# Patient Record
Sex: Male | Born: 1995 | Race: Black or African American | Hispanic: No | Marital: Single | Smoking: Never smoker
Health system: Southern US, Community
[De-identification: ages and names within clinical notes are randomized; demographics above are authoritative.]

---
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
## 2019-08-10 DIAGNOSIS — B356 Tinea cruris: Secondary | ICD-10-CM | POA: Diagnosis not present

## 2019-11-28 DIAGNOSIS — J209 Acute bronchitis, unspecified: Secondary | ICD-10-CM | POA: Diagnosis not present

## 2019-11-28 DIAGNOSIS — Z20828 Contact with and (suspected) exposure to other viral communicable diseases: Secondary | ICD-10-CM | POA: Diagnosis not present

## 2019-11-28 DIAGNOSIS — R05 Cough: Secondary | ICD-10-CM | POA: Diagnosis not present

## 2019-11-28 DIAGNOSIS — R0981 Nasal congestion: Secondary | ICD-10-CM | POA: Diagnosis not present

## 2020-08-26 DIAGNOSIS — M79652 Pain in left thigh: Secondary | ICD-10-CM | POA: Diagnosis not present

## 2021-09-11 ENCOUNTER — Emergency Department (HOSPITAL_COMMUNITY): Payer: BC Managed Care – PPO

## 2021-09-11 ENCOUNTER — Emergency Department (HOSPITAL_COMMUNITY)
Admission: EM | Admit: 2021-09-11 | Discharge: 2021-09-12 | Disposition: A | Discharge status: Home / Self Care | Payer: BC Managed Care – PPO | Attending: Emergency Medicine | Admitting: Emergency Medicine

## 2021-09-11 ENCOUNTER — Other Ambulatory Visit: Payer: Self-pay

## 2021-09-11 ENCOUNTER — Encounter (HOSPITAL_COMMUNITY): Payer: Self-pay

## 2021-09-11 DIAGNOSIS — H509 Unspecified strabismus: Secondary | ICD-10-CM | POA: Diagnosis not present

## 2021-09-11 DIAGNOSIS — H532 Diplopia: Secondary | ICD-10-CM | POA: Diagnosis not present

## 2021-09-11 DIAGNOSIS — H5712 Ocular pain, left eye: Secondary | ICD-10-CM | POA: Diagnosis not present

## 2021-09-11 DIAGNOSIS — R519 Headache, unspecified: Secondary | ICD-10-CM | POA: Diagnosis not present

## 2021-09-11 LAB — CBC
HCT: 48.4 % (ref 39.0–52.0)
Hemoglobin: 16.3 g/dL (ref 13.0–17.0)
MCH: 29.6 pg (ref 26.0–34.0)
MCHC: 33.7 g/dL (ref 30.0–36.0)
MCV: 87.8 fL (ref 80.0–100.0)
Platelets: 171 10*3/uL (ref 150–400)
RBC: 5.51 MIL/uL (ref 4.22–5.81)
RDW: 11.4 % — ABNORMAL LOW (ref 11.5–15.5)
WBC: 7.9 10*3/uL (ref 4.0–10.5)
nRBC: 0 % (ref 0.0–0.2)

## 2021-09-11 LAB — BASIC METABOLIC PANEL
Anion gap: 5 (ref 5–15)
BUN: 20 mg/dL (ref 6–20)
CO2: 28 mmol/L (ref 22–32)
Calcium: 9.6 mg/dL (ref 8.9–10.3)
Chloride: 107 mmol/L (ref 98–111)
Creatinine, Ser: 1.1 mg/dL (ref 0.61–1.24)
GFR, Estimated: >60 mL/min (ref >60)
Glucose, Bld: 95 mg/dL (ref 70–99)
Potassium: 4 mmol/L (ref 3.5–5.1)
Sodium: 140 mmol/L (ref 135–145)

## 2021-09-11 NOTE — ED Provider Triage Note (Signed)
Emergency Medicine Provider Triage Evaluation Note  Lee Delgado , a 26 y.o. male  was evaluated in triage.  Pt was seen earlier today in urgent care and advised to come to ED for head CT.  Patient was playing basketball last Wednesday when he ran into another player causing him to fall to the ground.  The patient denies hitting his head or losing consciousness.  Patient states that beginning on Monday he developed left-sided headache along with double vision and left eye.  Patient denies any numbness, weakness, nausea, vomiting, chest pain, headache, neck pain.  Review of Systems  Positive:  Negative:   Physical Exam  BP 137/81 (BP Location: Right Arm)   Pulse 70   Temp 98.9 F (37.2 C) (Oral)   Resp 18   SpO2 100%  Gen:   Awake, no distress   Resp:  Normal effort  MSK:   Moves extremities without difficulty  Other:  No focal neurodeficits on examination.  Patient has no pain with EOM.  Medical Decision Making  Medically screening exam initiated at 8:56 PM.  Appropriate orders placed.  Lee Delgado was informed that the remainder of the evaluation will be completed by another provider, this initial triage assessment does not replace that evaluation, and the importance of remaining in the ED until their evaluation is complete.     Al Decant, PA-C 09/11/21 2057

## 2021-09-11 NOTE — ED Provider Notes (Signed)
[facility] EMERGENCY DEPARTMENT Provider Note   CSN: [id number] Arrival date & time: 09/11/21  2007     History  Chief Complaint  Patient presents with   Eye Pain    Lee Delgado is a 26 y.o. male.   Eye Pain   Patient presents to the ED for evaluation of diplopia.  Patient states he was playing basketball on Wednesday.  He ran into another player and ended up falling to the ground.  He did not hit his head or lose consciousness.  Patient then started developing issues with headache and double vision.  Patient states he is not having trouble with his speech.  No vomiting or diarrhea.  No focal numbness or weakness.  Diplopia resolves when he closes 1 eye.  Patient states he went to an urgent care and he was sent to the ED for further evaluation.  Patient's girlfriend states earlier it looked like his eyes were almost cross side  Home Medications Prior to Admission medications   Not on File      Allergies    Patient has no allergy information on record.    Review of Systems   Review of Systems  Constitutional:  Negative for fever.  Eyes:  Positive for pain.   Physical Exam Updated Vital Signs BP (!) 141/84   Pulse 60   Temp 98.9 F (37.2 C) (Oral)   Resp 16   SpO2 98%  Physical Exam Vitals and nursing note reviewed.  Constitutional:      General: He is not in acute distress.    Appearance: He is well-developed.  HENT:     Head: Normocephalic and atraumatic.     Right Ear: External ear normal.     Left Ear: External ear normal.  Eyes:     General: No scleral icterus.       Right eye: No discharge.        Left eye: No discharge.     Extraocular Movements: Extraocular movements intact.     Conjunctiva/sclera: Conjunctivae normal.     Pupils: Pupils are equal, round, and reactive to light.  Neck:     Trachea: No tracheal deviation.  Cardiovascular:     Rate and Rhythm: Normal rate and regular rhythm.  Pulmonary:     Effort: Pulmonary  effort is normal. No respiratory distress.     Breath sounds: Normal breath sounds. No stridor. No wheezing or rales.  Abdominal:     General: Bowel sounds are normal. There is no distension.     Palpations: Abdomen is soft.     Tenderness: There is no abdominal tenderness. There is no guarding or rebound.  Musculoskeletal:        General: No tenderness or deformity.     Cervical back: Neck supple.  Skin:    General: Skin is warm and dry.     Findings: No rash.  Neurological:     General: No focal deficit present.     Mental Status: He is alert.     Cranial Nerves: No cranial nerve deficit (no facial droop, extraocular movements intact, no slurred speech).     Sensory: No sensory deficit.     Motor: No abnormal muscle tone or seizure activity.     Coordination: Coordination normal.  Psychiatric:        Mood and Affect: Mood normal.    ED Results / Procedures / Treatments   Labs (all labs ordered are listed, but only abnormal results are displayed)  Labs Reviewed  CBC - Abnormal; Notable for the following components:      Result Value   RDW 11.4 (*)    All other components within normal limits  BASIC METABOLIC PANEL    EKG None  Radiology CT Head Wo Contrast  Result Date: 09/11/2021 CLINICAL DATA:  Head trauma, moderate-severe Patient reports eye pain and headache. EXAM: CT HEAD WITHOUT CONTRAST TECHNIQUE: Contiguous axial images were obtained from the base of the skull through the vertex without intravenous contrast. RADIATION DOSE REDUCTION: This exam was performed according to the departmental dose-optimization program which includes automated exposure control, adjustment of the mA and/or kV according to patient size and/or use of iterative reconstruction technique. COMPARISON:  None Available. FINDINGS: Brain: No intracranial hemorrhage, mass effect, or midline shift. No hydrocephalus. The basilar cisterns are patent. No evidence of territorial infarct or acute ischemia. No  extra-axial or intracranial fluid collection. Vascular: No hyperdense vessel or unexpected calcification. Skull: No fracture or focal lesion. Sinuses/Orbits: Small mucous retention cyst left maxillary sinus. Paranasal sinuses are otherwise clear. Mastoid air cells are well aerated. The visualized orbits are unremarkable. Other: None. IMPRESSION: Negative noncontrast head CT. Electronically Signed   By: Narda Rutherford M.D.   On: 09/11/2021 22:12    Procedures Procedures    Medications Ordered in ED Medications - No data to display  ED Course/ Medical Decision Making/ A&P Clinical Course as of 09/11/21 2342  Thu Sep 11, 2021  2342 Basic metabolic panel Normal [JK]  2342 CBC(!) White blood cell count pending but otherwise normal [JK]  2342 Head CT without acute findings [JK]    Clinical Course User Index [JK] Linwood Dibbles, MD                           Medical Decision Making Patient presents with diplopia differential concerning for the possibility stroke, vascular abnormality, cranial nerve palsy, MS, doubt related to concussion  Amount and/or Complexity of Data Reviewed Labs: ordered. Radiology: ordered and independent interpretation performed.    Details: Head CT without acute findings   Patient presents to the ED with complaints of diplopia.  Presentation concerning for the possibility of stroke, MS, cranial nerve palsy.  We will proceed with MRI of the brain and orbits.  Results are pending at the time of shift change.  Care turned over to oncoming MD        Final Clinical Impression(s) / ED Diagnoses Final diagnoses:  Diplopia    Rx / DC Orders ED Discharge Orders     None         Linwood Dibbles, MD 09/11/21 2348

## 2021-09-11 NOTE — ED Triage Notes (Signed)
Pt reports he was seen at [facility] today due to eye pain / headache and was sent to ED for further eval.

## 2021-09-12 DIAGNOSIS — H532 Diplopia: Secondary | ICD-10-CM | POA: Diagnosis not present

## 2021-09-12 DIAGNOSIS — H4912 Fourth [trochlear] nerve palsy, left eye: Secondary | ICD-10-CM | POA: Diagnosis not present

## 2021-09-12 MED ORDER — GADOBUTROL 1 MMOL/ML IV SOLN
7.0000 mL | Freq: Once | INTRAVENOUS | Status: AC | PRN
  Administered 2021-09-12: 7 mL via INTRAVENOUS

## 2021-09-12 NOTE — ED Provider Notes (Signed)
Patient Physical Exam  BP 124/80   Pulse 60   Temp 98.9 F (37.2 C) (Oral)   Resp 16   SpO2 98%   Physical Exam Vitals and nursing note reviewed.  Constitutional:      General: He is not in acute distress.    Appearance: Normal appearance. He is not ill-appearing.  HENT:     Head: Normocephalic and atraumatic.  Pulmonary:     Effort: Pulmonary effort is normal.  Skin:    General: Skin is warm and dry.  Neurological:     Mental Status: He is alert and oriented to person, place, and time.    Procedures  Procedures  ED Course / MDM   Clinical Course as of 09/12/21 0220  Thu Sep 11, 2021  2342 Basic metabolic panel Normal [JK]  2342 CBC(!) White blood cell count pending but otherwise normal [JK]  2342 Head CT without acute findings [JK]    Clinical Course User Index [JK] Linwood Dibbles, MD   Care assumed from Dr. Lynelle Doctor at shift change.  Awaiting results of MRI of the brain and orbits to evaluate for the cause of his double vision.  The studies have returned and are unremarkable.  He is well-appearing and in no distress.  He does describe some binocular double vision that corrects with covering either eye.  Nothing tonight appears emergent.  Patient will be referred to ophthalmology for outpatient eye exam.       Geoffery Lyons, MD 09/12/21 0221

## 2021-09-12 NOTE — ED Notes (Signed)
Pt and visitor express frustration about wait time associated with MRI results. Explained the process of receiving results in ER and encouraged pt to stay for results.

## 2021-09-12 NOTE — ED Notes (Signed)
Pt verbalized understanding of d/c instructions, meds, and followup care. Denies questions. VSS, no distress noted. Steady gait to exit with all belongings.  ?

## 2021-09-12 NOTE — Discharge Instructions (Signed)
Follow-up with ophthalmology in the next 2 to 3 days.  The contact information for Dr. Tobe Sos has been provided in this discharge summary for you to call and make these arrangements.

## 2021-09-15 DIAGNOSIS — Z6822 Body mass index (BMI) 22.0-22.9, adult: Secondary | ICD-10-CM | POA: Diagnosis not present

## 2021-09-15 DIAGNOSIS — H532 Diplopia: Secondary | ICD-10-CM | POA: Diagnosis not present

## 2021-10-09 DIAGNOSIS — H538 Other visual disturbances: Secondary | ICD-10-CM | POA: Diagnosis not present

## 2021-10-09 DIAGNOSIS — H5010 Unspecified exotropia: Secondary | ICD-10-CM | POA: Diagnosis not present

## 2021-10-09 DIAGNOSIS — H532 Diplopia: Secondary | ICD-10-CM | POA: Diagnosis not present

## 2022-11-18 ENCOUNTER — Ambulatory Visit
Admission: EM | Admit: 2022-11-18 | Discharge: 2022-11-18 | Disposition: A | Discharge status: Home / Self Care | Payer: BC Managed Care – PPO

## 2022-11-18 DIAGNOSIS — J029 Acute pharyngitis, unspecified: Secondary | ICD-10-CM | POA: Diagnosis not present

## 2022-11-18 NOTE — ED Provider Notes (Signed)
[facility]    CSN: [id number] Arrival date & time: 11/18/22  1308      History   Chief Complaint Chief Complaint  Patient presents with   Sore Throat    HPI Lee Delgado is a 27 y.o. male.   Patient presents to urgent care for evaluation of sore throat that started yesterday. Reports intermittent sneezing and post nasal drip as well. Sore throat is worsened with swallowing and is currently 6/10, improves with use of halls cough drops. Denies cough, headache, fevers, chills, N/V/D, abdominal pain, or rash. No recent sick contacts. Never smoker, denies drug use. No trismus or difficulty swallowing. No ear pain, eye redness/itching, etc. Has been using cough drops for symptoms with some relief.  Took alkaseltzer tablet and allergy pill and this didn't really help.    Sore Throat    History reviewed. No pertinent past medical history.  There are no problems to display for this patient.   History reviewed. No pertinent surgical history.     Home Medications    Prior to Admission medications   Not on File    Family History History reviewed. No pertinent family history.  Social History Social History   Tobacco Use   Smoking status: Never   Smokeless tobacco: Never     Allergies   Patient has no known allergies.   Review of Systems Review of Systems Per HPI  Physical Exam Triage Vital Signs ED Triage Vitals  Encounter Vitals Group     BP 11/18/22 0935 136/84     Systolic BP Percentile --      Diastolic BP Percentile --      Pulse Rate 11/18/22 0935 82     Resp 11/18/22 0935 18     Temp 11/18/22 0935 98 F (36.7 C)     Temp Source 11/18/22 0935 Oral     SpO2 11/18/22 0935 97 %     Weight --      Height --      Head Circumference --      Peak Flow --      Pain Score 11/18/22 0934 7     Pain Loc --      Pain Education --      Exclude from Growth Chart --    No data found.  Updated Vital Signs BP 136/84 (BP Location: Right Arm)    Pulse 82   Temp 98 F (36.7 C) (Oral)   Resp 18   SpO2 97%   Visual Acuity Right Eye Distance:   Left Eye Distance:   Bilateral Distance:    Right Eye Near:   Left Eye Near:    Bilateral Near:     Physical Exam Vitals and nursing note reviewed.  Constitutional:      Appearance: He is not ill-appearing or toxic-appearing.  HENT:     Head: Normocephalic and atraumatic.     Right Ear: Hearing, tympanic membrane, ear canal and external ear normal.     Left Ear: Hearing, tympanic membrane, ear canal and external ear normal.     Nose: Nose normal.     Mouth/Throat:     Lips: Pink.     Mouth: Mucous membranes are moist. No injury.     Tongue: No lesions. Tongue does not deviate from midline.     Palate: No mass and lesions.     Pharynx: Oropharynx is clear. Uvula midline. No pharyngeal swelling, oropharyngeal exudate, posterior oropharyngeal erythema or uvula swelling.  Tonsils: No tonsillar exudate or tonsillar abscesses.  Eyes:     General: Lids are normal. Vision grossly intact. Gaze aligned appropriately.     Extraocular Movements: Extraocular movements intact.     Conjunctiva/sclera: Conjunctivae normal.  Cardiovascular:     Rate and Rhythm: Normal rate and regular rhythm.     Heart sounds: Normal heart sounds, S1 normal and S2 normal.  Pulmonary:     Effort: Pulmonary effort is normal. No respiratory distress.     Breath sounds: Normal breath sounds and air entry.  Musculoskeletal:     Cervical back: Neck supple.  Skin:    General: Skin is warm and dry.     Capillary Refill: Capillary refill takes less than 2 seconds.     Findings: No rash.  Neurological:     General: No focal deficit present.     Mental Status: He is alert and oriented to person, place, and time. Mental status is at baseline.     Cranial Nerves: No dysarthria or facial asymmetry.  Psychiatric:        Mood and Affect: Mood normal.        Speech: Speech normal.        Behavior: Behavior normal.         Thought Content: Thought content normal.        Judgment: Judgment normal.      UC Treatments / Results  Labs (all labs ordered are listed, but only abnormal results are displayed) Labs Reviewed - No data to display  EKG   Radiology No results found.  Procedures Procedures (including critical care time)  Medications Ordered in UC Medications - No data to display  Initial Impression / Assessment and Plan / UC Course  I have reviewed the triage vital signs and the nursing notes.  Pertinent labs & imaging results that were available during my care of the patient were reviewed by me and considered in my medical decision making (see chart for details).   1. Viral pharyngitis Evaluation suggests viral pharyngitis etiology.  Low suspicion for bacterial pharyngitis, patient does not meet [facility] criteria for group A strep testing, therefore deferred this with patient agreement.  Low suspicion for mononucleosis, epiglottitis, peritonsillar abscess, etc.  HEENT exam stable without red flag signs. Will manage this conservatively with supportive care. Tylenol/ibuprofen as needed for pain and inflammation. Salt water gargles as needed, warm water with honey.   Counseled patient on potential for adverse effects with medications prescribed/recommended today, strict ER and return-to-clinic precautions discussed, patient verbalized understanding.    Final Clinical Impressions(s) / UC Diagnoses   Final diagnoses:  Viral pharyngitis     Discharge Instructions      For now, we will treat this as a viral infection with the following: - Over the counter medicines as needed for pain and swelling of the throat (Ibuprofen 600mg  and/or Tylenol 1,000mg  every 6 hours as needed) - 1 tablespoon of honey in warm water and/or salt water gargles every 3-4 hours  Seek medical care if you develop changes to your voice, inability to swallow, drooling, or any new symptoms. If symptoms are  severe, please go to the ER. I hope you feel better!      ED Prescriptions   None    PDMP not reviewed this encounter.   Carlisle Beers, [place] 11/18/22 1032

## 2022-11-18 NOTE — Discharge Instructions (Signed)
For now, we will treat this as a viral infection with the following: - Over the counter medicines as needed for pain and swelling of the throat (Ibuprofen 600mg  and/or Tylenol 1,000mg  every 6 hours as needed) - 1 tablespoon of honey in warm water and/or salt water gargles every 3-4 hours  Seek medical care if you develop changes to your voice, inability to swallow, drooling, or any new symptoms. If symptoms are severe, please go to the ER. I hope you feel better!

## 2022-11-18 NOTE — ED Triage Notes (Signed)
Pt presents with a sore throat since yesterday morning. Pt has taken an allergy pill and alka seltzer yesterday before going to sleep. Pt denies fever.

## 2023-07-17 IMAGING — MR MR HEAD WO/W CM
14 of 16 series · 42 of 48 positions shown · IV contrast (gadavist)
Comparison: Prior CT from 09/11/2021.

CLINICAL DATA: Initial evaluation for diplopia.

EXAM:
MRI HEAD AND ORBITS WITHOUT AND WITH CONTRAST
TECHNIQUE: Multiplanar, multiecho pulse sequences of the brain and surrounding
structures were obtained without and with intravenous contrast.
Multiplanar, multiecho pulse sequences of the orbits and surrounding
structures were obtained including fat saturation techniques, before
and after intravenous contrast administration.
CONTRAST:  7mL GADAVIST GADOBUTROL 1 MMOL/ML IV SOLN

[Series 5: T1 · sagittal · 5.0mm · 0.75mm/px · 3 of 24 slices shown]
[im 1/24]
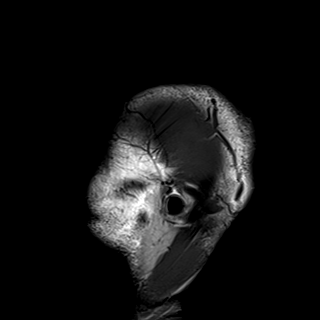
[im 12/24]
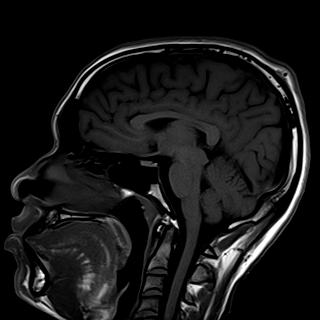
[im 24/24]
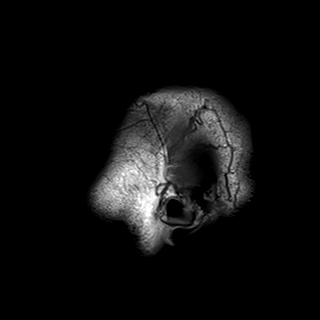

[Series 6: mag_images · axial · 3.0mm · 0.90mm/px · z∈[-7,+145]mm · 3 of 52 slices shown]
[im 1/52]
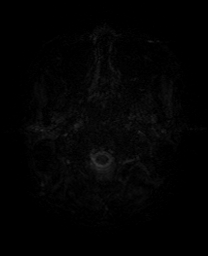
[im 26/52]
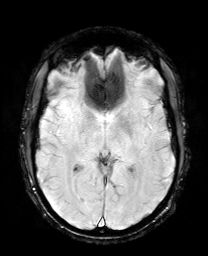
[im 52/52]
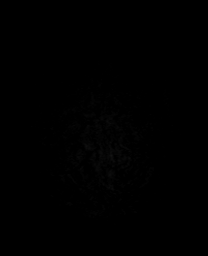

[Series 7: pha_images · axial · 3.0mm · 0.90mm/px · z∈[-7,+142]mm · 3 of 51 slices shown]
[im 1/51]
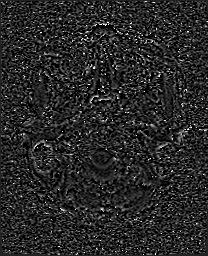
[im 26/51]
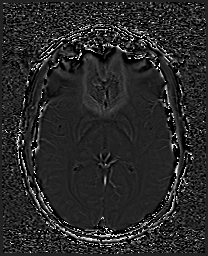
[im 51/51]
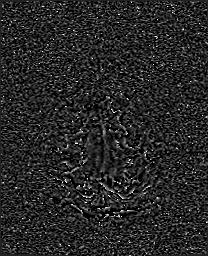

[Series 8: swi_images · axial · 3.0mm · 0.90mm/px · z∈[-7,+145]mm · 3 of 52 slices shown]
[im 1/52]
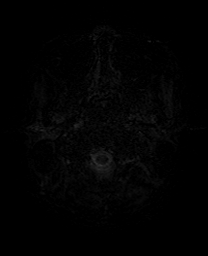
[im 26/52]
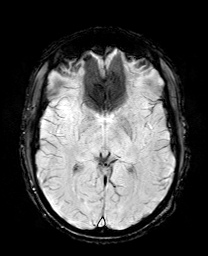
[im 52/52]
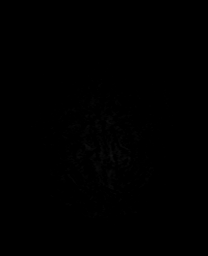

[Series 9: mip_images(sw) · axial · 24.0mm · 0.90mm/px · z∈[+3,+135]mm · 3 of 45 slices shown]
[im 1/45]
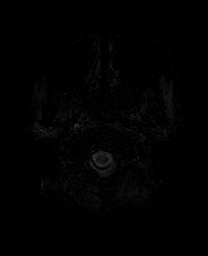
[im 23/45]
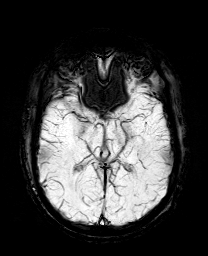
[im 45/45]
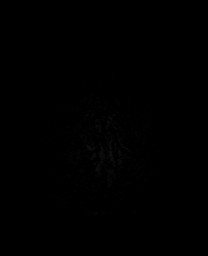

[Series 10: DWI · axial · 3.0mm · 0.88mm/px · z∈[-7,+139]mm · 7 of 100 slices shown (1 of 4)]
[im 1/100]
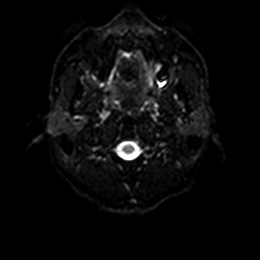
[im 17/100]
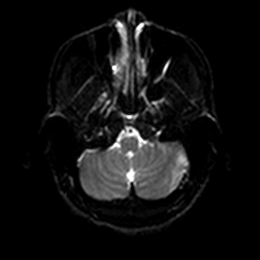
[im 34/100]
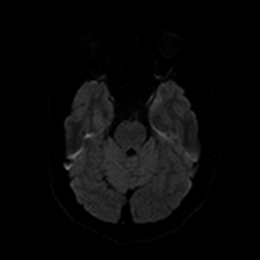
[im 50/100]
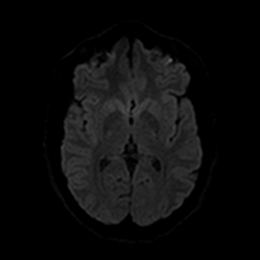
[im 67/100]
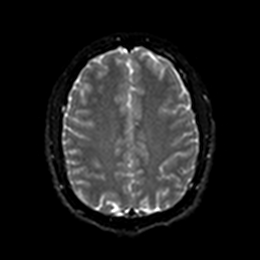
[im 83/100]
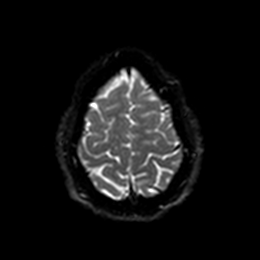
[im 100/100]
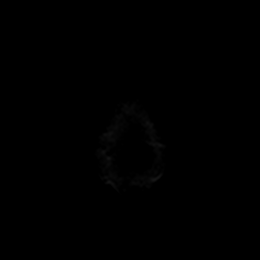

[Series 11: DWI · axial · 3.0mm · 0.88mm/px · z∈[-7,+139]mm · 3 of 49 slices shown (2 of 4)]
[im 1/49]
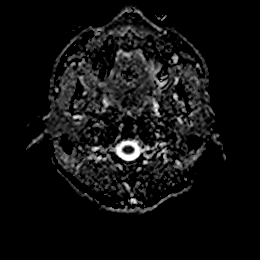
[im 25/49]
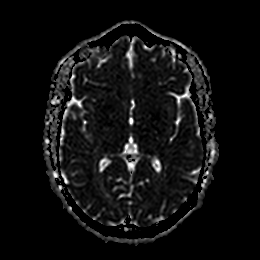
[im 49/49]
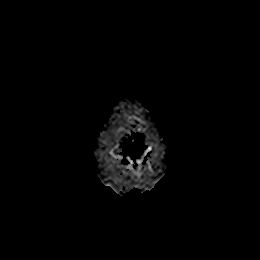

[Series 12: DWI · coronal · 4.0mm · 0.88mm/px · 5 of 72 slices shown (3 of 4)]
[im 1/72]
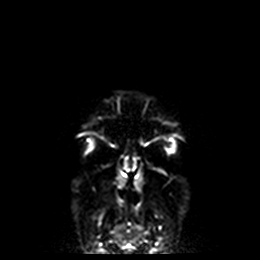
[im 18/72]
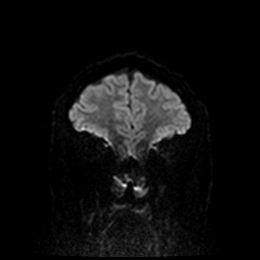
[im 36/72]
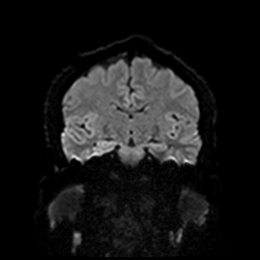
[im 54/72]
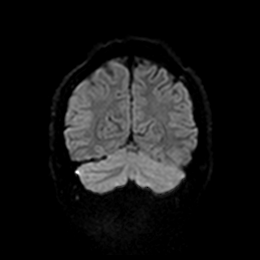
[im 72/72]
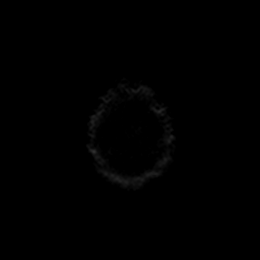

[Series 13: DWI · coronal · 4.0mm · 0.88mm/px · 2 of 36 slices shown (4 of 4)]
[im 1/36]
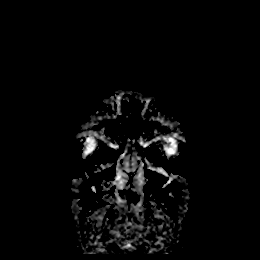
[im 36/36]
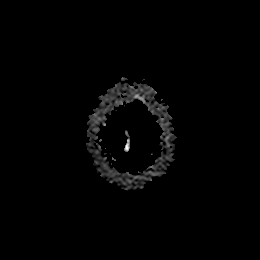

[Series 14: T2 · axial · 5.0mm · 0.72mm/px · z∈[-10,+140]mm · 2 of 26 slices shown]
[im 1/26]
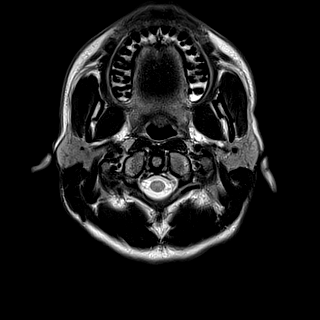
[im 26/26]
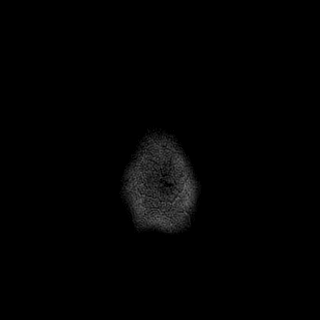

[Series 15: FLAIR · axial · 5.0mm · 0.45mm/px · z∈[-10,+140]mm · 2 of 26 slices shown]
[im 1/26]
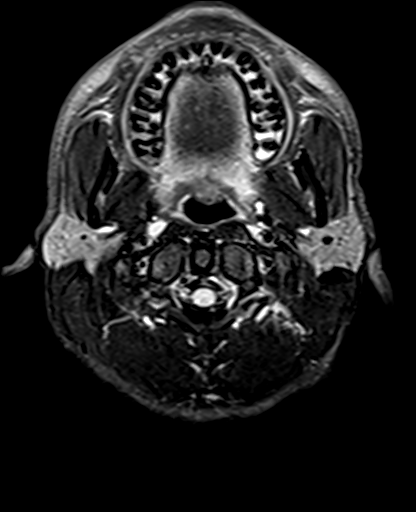
[im 26/26]
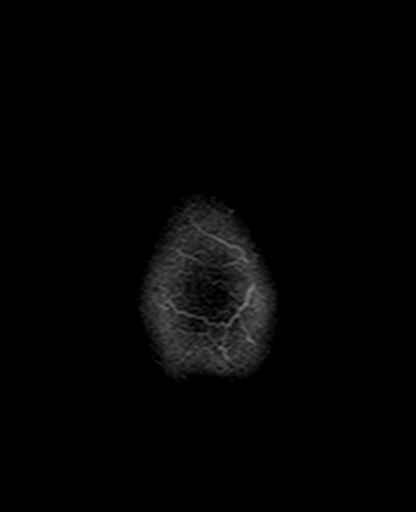

[Series 17: T2 post-contrast · coronal · 5.0mm · 0.72mm/px · 2 of 31 slices shown]
[im 1/31]
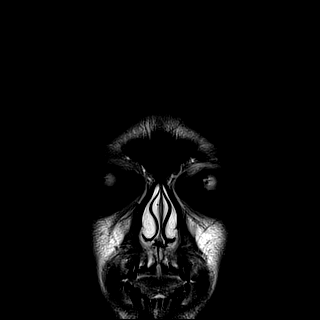
[im 31/31]
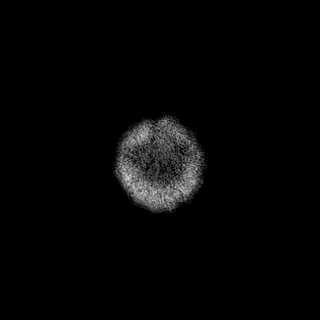

[Series 19: T1 post-contrast · coronal · 5.0mm · 0.34mm/px · 2 of 31 slices shown (1 of 2)]
[im 1/31]
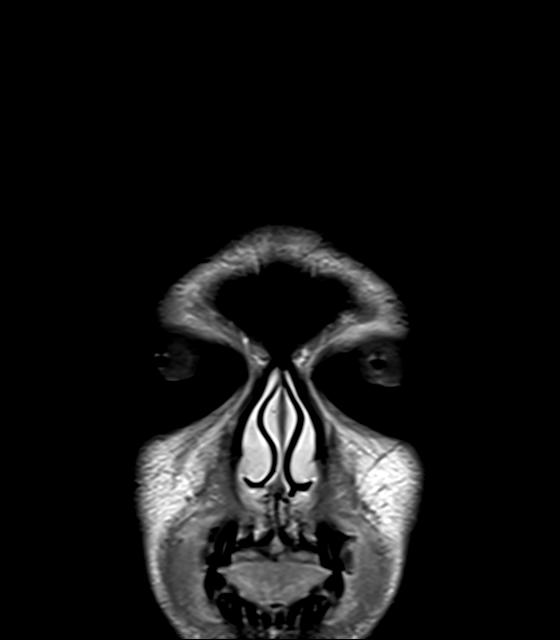
[im 31/31]
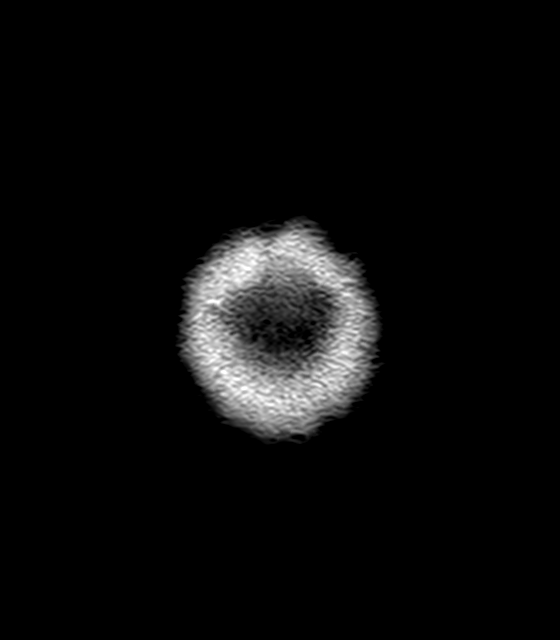

[Series 20: T1 post-contrast · sagittal · 5.0mm · 0.75mm/px · 2 of 24 slices shown (2 of 2)]
[im 1/24]
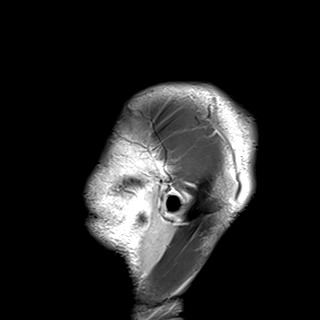
[im 24/24]
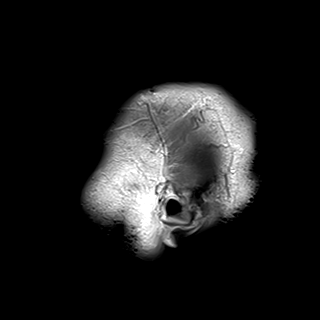

[42 of 48 positions shown; findings below may reference images not displayed]

FINDINGS: MRI HEAD FINDINGS

Brain: Cerebral volume within normal limits. No focal parenchymal
signal abnormality. No abnormal foci of restricted diffusion to
suggest acute or subacute ischemia. Gray-white matter
differentiation maintained. No encephalomalacia to suggest chronic
cortical infarction or other insult. No acute or chronic
intracranial blood products.

No mass lesion, midline shift or mass effect. No hydrocephalus or
extra-axial fluid collection. Pituitary gland suprasellar region
within normal limits. Midline structures intact and normally formed.
No abnormal enhancement.

Vascular: Major intracranial vascular flow voids are maintained.

Skull and upper cervical spine: Craniocervical junction within
normal limits. Bone marrow signal intensity normal. No scalp soft
tissue abnormality.

Other: No mastoid effusion.  Inner ear structures grossly normal.

MRI ORBITS FINDINGS

Orbits: Globes are symmetric in size with normal appearance and
morphology bilaterally. Optic nerves symmetric and within normal
limits. No abnormality about either optic nerve sheath. No intrinsic
optic nerve edema or enhancement. Intraconal and extraconal fat
well-maintained. Extra-ocular muscles symmetric and normal. Lacrimal
glands normal. No abnormality about the orbital apices or cavernous
sinus. Superior orbital veins symmetric and normal.

Visualized sinuses: Mild scattered mucosal thickening present about
the ethmoidal air cells and maxillary sinuses. Paranasal sinuses are
otherwise clear.

Soft tissues: Visualized periorbital soft tissues demonstrate no
acute finding.
IMPRESSION: Normal MRI of the brain and orbits. No findings to explain patient's
symptoms identified.

## 2023-07-17 IMAGING — CT CT HEAD W/O CM
3 series · 15 of 47 positions shown, 18 images · non-contrast
Comparison: None Available.

CLINICAL DATA: Head trauma, moderate-severe

Patient reports eye pain and headache.
EXAM:
CT HEAD WITHOUT CONTRAST
TECHNIQUE: Contiguous axial images were obtained from the base of the skull
through the vertex without intravenous contrast.
RADIATION DOSE REDUCTION: This exam was performed according to the
departmental dose-optimization program which includes automated
exposure control, adjustment of the mA and/or kV according to
patient size and/or use of iterative reconstruction technique.

[Series 3: head 5.0 h30s · axial · 0.44mm/px · z∈[+194,+324]mm · 9 of 32 slices shown, 12 images]
[im 3/32  brain]
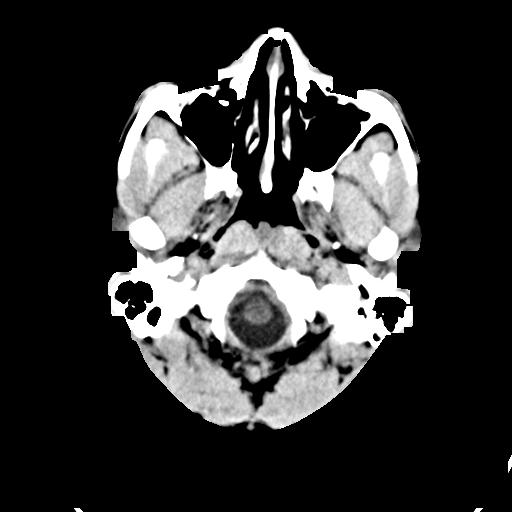
[im 3/32  bone]
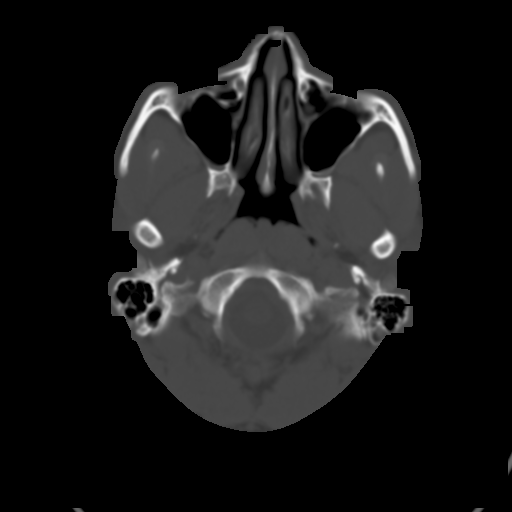
[im 6/32  brain]
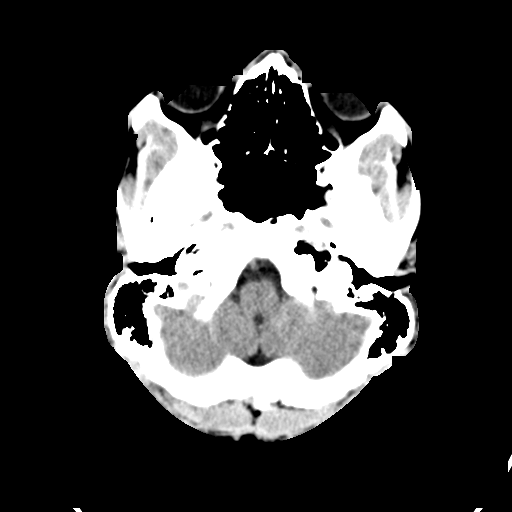
[im 9/32  brain]
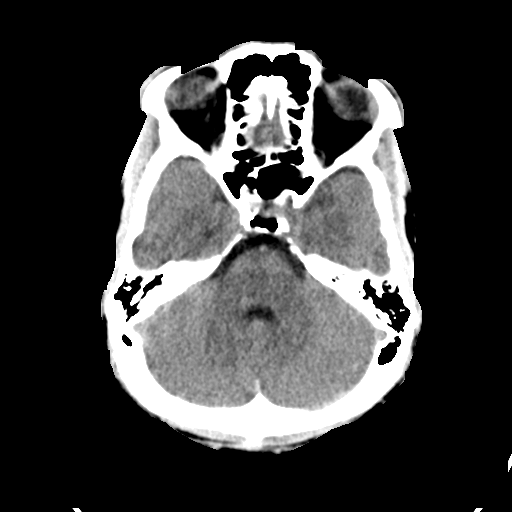
[im 12/32  brain]
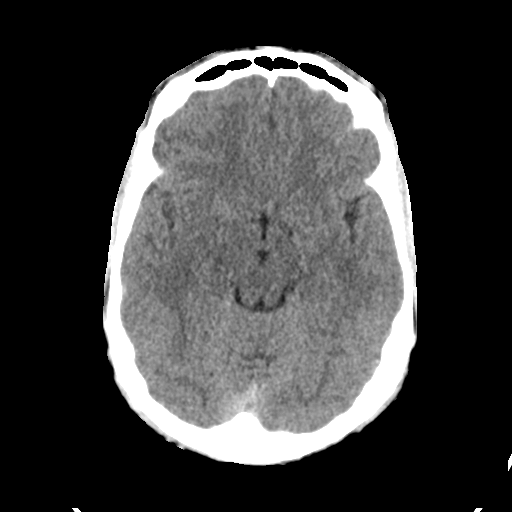
[im 17/32  brain]
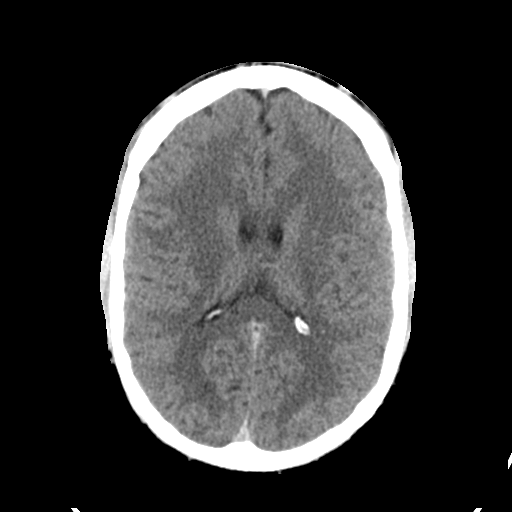
[im 17/32  bone]
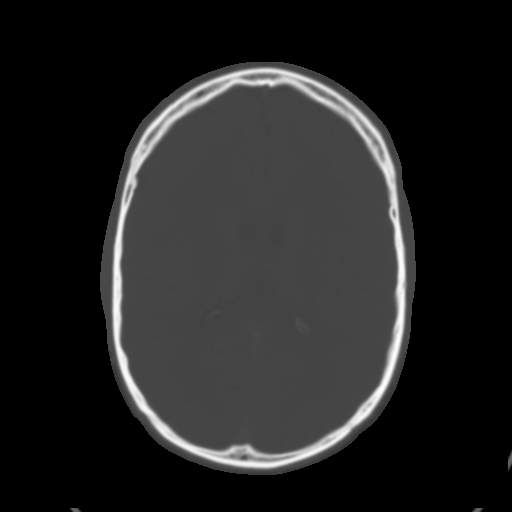
[im 20/32  brain]
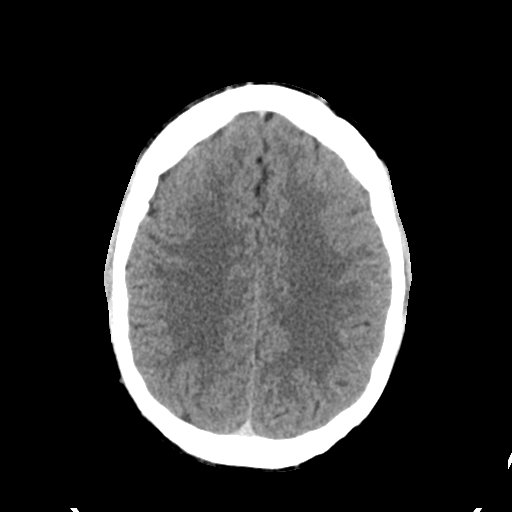
[im 23/32  brain]
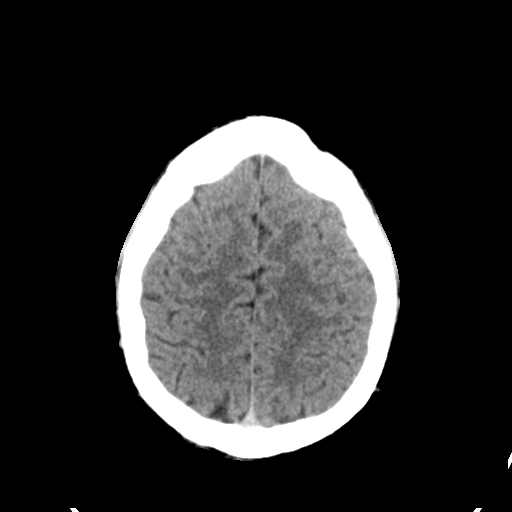
[im 26/32  brain]
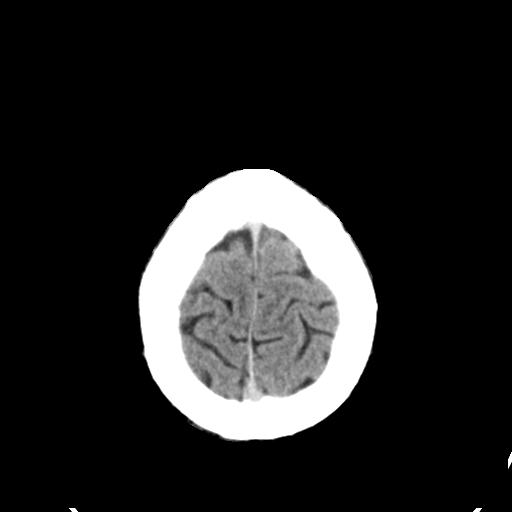
[im 29/32  brain]
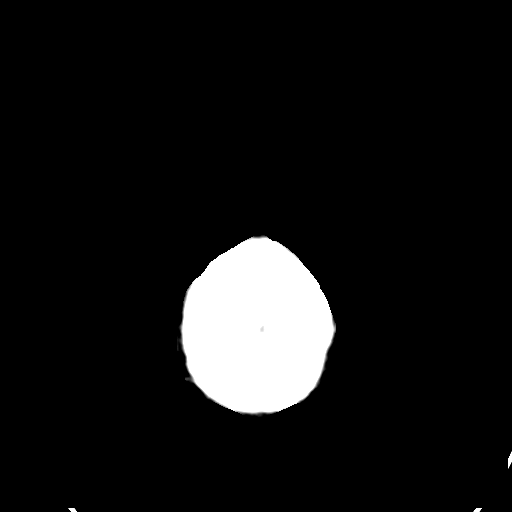
[im 29/32  bone]
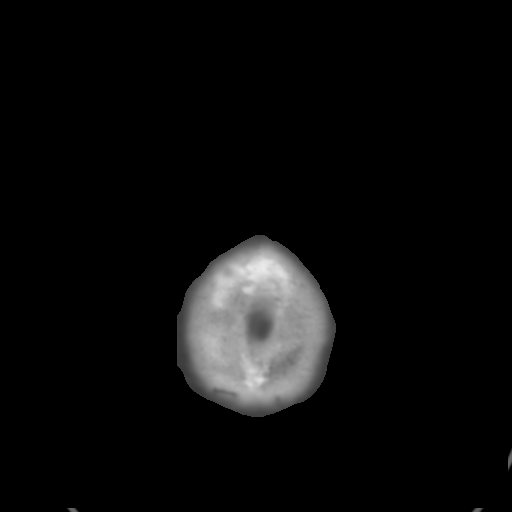

[Series 5: head 3.0 mpr cor · coronal · 0.31mm/px · 3 of 71 slices shown]
[im 24/71  brain]
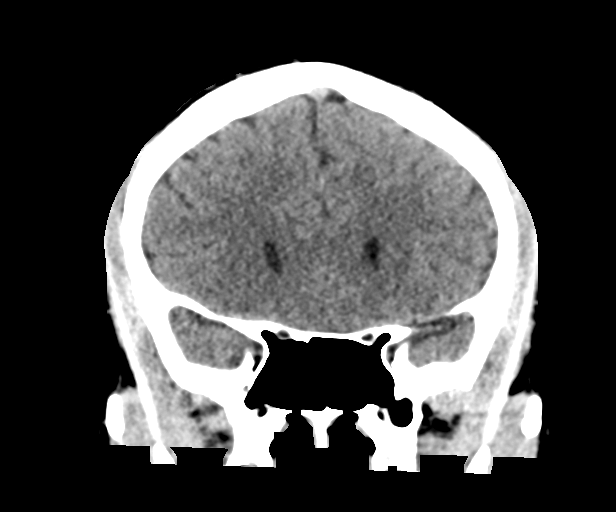
[im 32/71  brain]
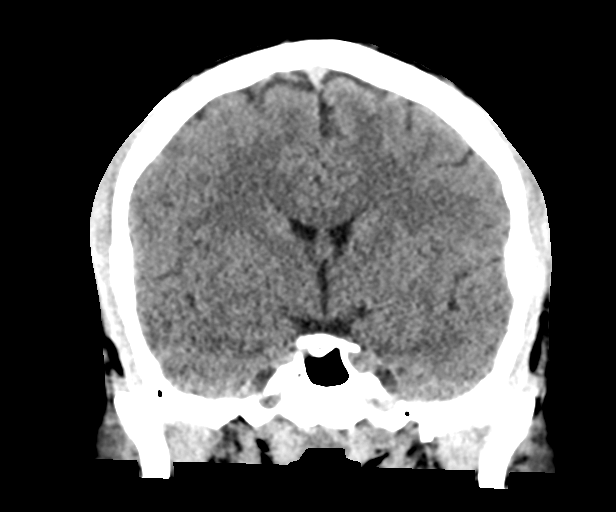
[im 39/71  brain]
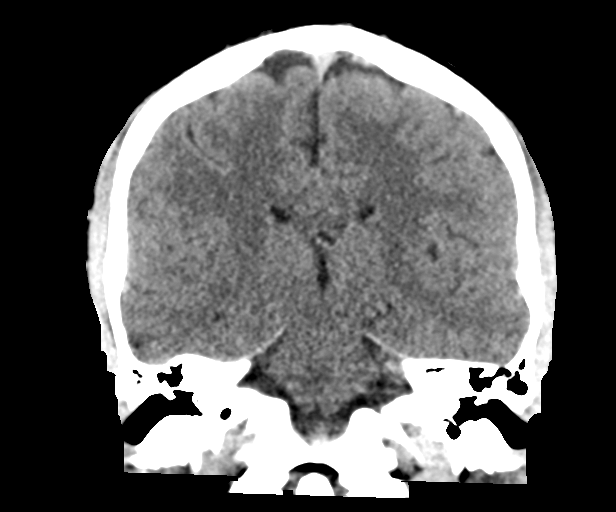

[Series 6: head 3.0 mpr sag · sagittal · 0.33mm/px · 3 of 61 slices shown]
[im 21/61  brain]
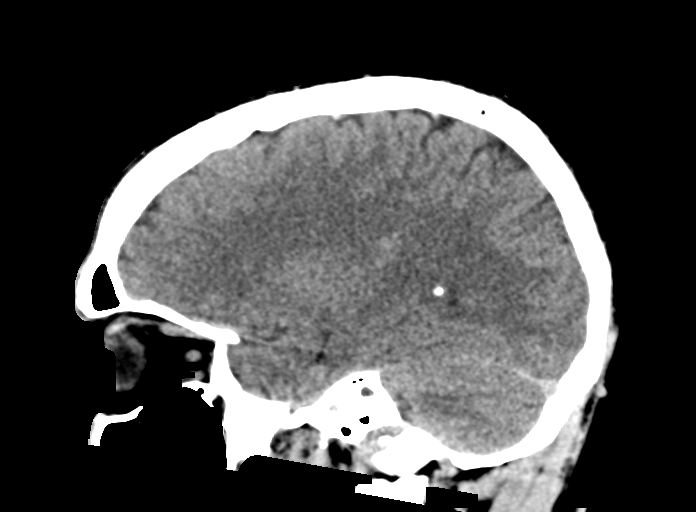
[im 31/61  brain]
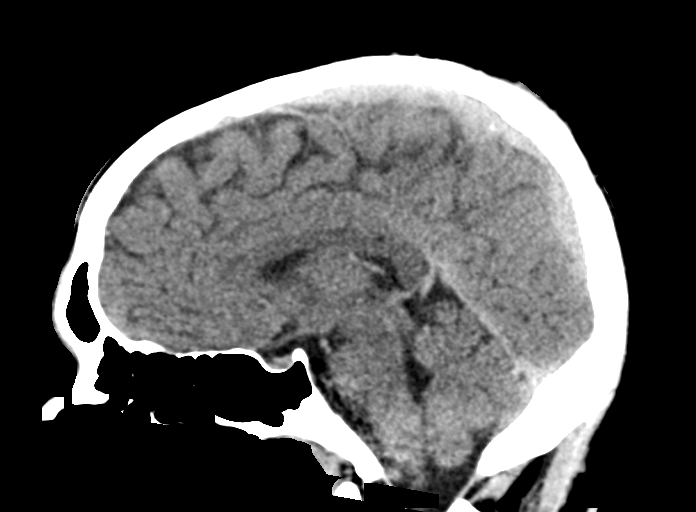
[im 41/61  brain]
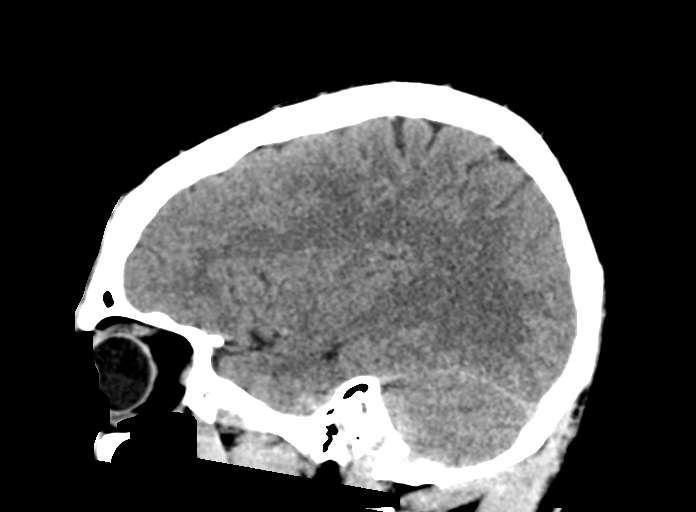

[15 of 47 positions shown; findings below may reference images not displayed]

FINDINGS: Brain: No intracranial hemorrhage, mass effect, or midline shift. No
hydrocephalus. The basilar cisterns are patent. No evidence of
territorial infarct or acute ischemia. No extra-axial or
intracranial fluid collection.

Vascular: No hyperdense vessel or unexpected calcification.

Skull: No fracture or focal lesion.

Sinuses/Orbits: Small mucous retention cyst left maxillary sinus.
Paranasal sinuses are otherwise clear. Mastoid air cells are well
aerated. The visualized orbits are unremarkable.

Other: None.
IMPRESSION: Negative noncontrast head CT.

## 2024-01-04 ENCOUNTER — Ambulatory Visit
Admission: EM | Admit: 2024-01-04 | Discharge: 2024-01-04 | Disposition: A | Discharge status: Home / Self Care | Attending: Family Medicine | Admitting: Family Medicine

## 2024-01-04 DIAGNOSIS — L42 Pityriasis rosea: Secondary | ICD-10-CM

## 2024-01-04 MED ORDER — FLUCONAZOLE 150 MG PO TABS: 150.0000 mg | ORAL_TABLET | ORAL | 0 refills | Status: AC

## 2024-01-04 NOTE — ED Provider Notes (Signed)
  [facility]  Note:  This document was prepared using Conservation officer, historic buildings and may include unintentional dictation errors.  MRN: [id number] DOB: 29-Mar-1996  Subjective:   Lee Delgado is a 28 y.o. male presenting for 1 week history of a scattered rash over the torso.  Patient reports that initially he had 1 spot over the left upper chest.  Patient applied calamine lotion to the area and hydrocortisone cream and then he had a rapid spread of the rest of his torso extending toward the extremities.  No fever, pain, drainage of pus or bleeding.  He has slight itching but not much.  No sick symptoms.  No current facility-administered medications for this encounter. No current outpatient medications on file.   No Known Allergies  History reviewed. No pertinent past medical history.   History reviewed. No pertinent surgical history.  No family history on file.  Social History   Tobacco Use   Smoking status: Never   Smokeless tobacco: Never  Vaping Use   Vaping status: Every Day  Substance Use Topics   Alcohol use: Yes    Comment: occ   Drug use: Not Currently    ROS   Objective:   Vitals: BP 132/87 (BP Location: Right Arm)   Pulse 67   Temp 98.9 F (37.2 C) (Oral)   Resp 16   SpO2 98%   Physical Exam Constitutional:      General: He is not in acute distress.    Appearance: Normal appearance. He is well-developed and normal weight. He is not ill-appearing, toxic-appearing or diaphoretic.  HENT:     Head: Normocephalic and atraumatic.     Right Ear: External ear normal.     Left Ear: External ear normal.     Nose: Nose normal.     Mouth/Throat:     Pharynx: Oropharynx is clear.  Eyes:     General: No scleral icterus.       Right eye: No discharge.        Left eye: No discharge.     Extraocular Movements: Extraocular movements intact.  Cardiovascular:     Rate and Rhythm: Normal rate.  Pulmonary:     Effort: Pulmonary  effort is normal.  Musculoskeletal:     Cervical back: Normal range of motion.  Skin:    Findings: Rash (diffusely scattered hyperpigmented lesions of varying sizes between 1/2cm-2cm mostly over the torso but also the proximal extremities) present.  Neurological:     Mental Status: He is alert and oriented to person, place, and time.  Psychiatric:        Mood and Affect: Mood normal.        Behavior: Behavior normal.        Thought Content: Thought content normal.        Judgment: Judgment normal.          Assessment and Plan :   PDMP not reviewed this encounter.  1. Pityriasis rosea    Patient very distinctly describes what I suspect was a herald patch over the left upper chest.  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  Very few lesions do have ringworm type appearance and therefore offered patient fluconazole  for 2 doses only.  Otherwise, general management of pityriasis rosea reviewed.  Anticipatory guidance provided.  Counseled patient on potential for adverse effects with medications prescribed/recommended today, ER and return-to-clinic precautions discussed, patient verbalized understanding.    Christopher Savannah, [place] 01/04/24 1902

## 2024-01-04 NOTE — ED Triage Notes (Signed)
 Pt c/o scattered rash vs bug bites-first noticed ~1 week ago-using calamine lotion and hydrocortisone cream-NAD-steady gait

## 2024-01-04 NOTE — Discharge Instructions (Addendum)
 Your rash is most consistent with pityriasis rosea.  The general management is for supportive care.  Another concern for possible fungal infection I am agreeable to use fluconazole  once a week for 2 doses.  But otherwise the rash should resolve on its own over time.
